# Patient Record
Sex: Male | Born: 1960 | Race: White | Hispanic: No | Marital: Married | Smoking: Current every day smoker
Health system: Southern US, Community
[De-identification: ages and names within clinical notes are randomized; demographics above are authoritative.]

## PROBLEM LIST (undated history)

## (undated) DIAGNOSIS — C411 Malignant neoplasm of mandible: Secondary | ICD-10-CM

## (undated) DIAGNOSIS — K7460 Unspecified cirrhosis of liver: Secondary | ICD-10-CM

## (undated) DIAGNOSIS — C799 Secondary malignant neoplasm of unspecified site: Secondary | ICD-10-CM

## (undated) HISTORY — PX: OTHER SURGICAL HISTORY: SHX169

---

## 2004-08-18 ENCOUNTER — Ambulatory Visit: Payer: Self-pay | Admitting: Occupational Therapy

## 2010-12-09 ENCOUNTER — Emergency Department (HOSPITAL_COMMUNITY)
Admission: EM | Admit: 2010-12-09 | Discharge: 2010-12-09 | Disposition: A | Discharge status: Home / Self Care | Payer: BC Managed Care – PPO | Attending: Emergency Medicine | Admitting: Emergency Medicine

## 2010-12-09 ENCOUNTER — Emergency Department (HOSPITAL_COMMUNITY): Payer: BC Managed Care – PPO

## 2010-12-09 DIAGNOSIS — F17200 Nicotine dependence, unspecified, uncomplicated: Secondary | ICD-10-CM | POA: Insufficient documentation

## 2010-12-09 DIAGNOSIS — J40 Bronchitis, not specified as acute or chronic: Secondary | ICD-10-CM | POA: Insufficient documentation

## 2010-12-09 MED ORDER — HYDROCODONE-ACETAMINOPHEN 5-325 MG PO TABS: 1.0000 | ORAL_TABLET | ORAL | Status: AC | PRN

## 2010-12-09 MED ORDER — HYDROCODONE-ACETAMINOPHEN 5-325 MG PO TABS
2.0000 | ORAL_TABLET | Freq: Once | ORAL | Status: AC
  Administered 2010-12-09: 2 via ORAL
  Filled 2010-12-09: qty 2

## 2010-12-09 MED ORDER — CYCLOBENZAPRINE HCL 10 MG PO TABS: 10.0000 mg | ORAL_TABLET | Freq: Two times a day (BID) | ORAL | Status: AC | PRN

## 2010-12-09 NOTE — ED Notes (Signed)
Pt states he is being tx for pneumonia. States he chest is still sore

## 2010-12-09 NOTE — ED Provider Notes (Signed)
Scribed for Donnetta Hutching, MD, the patient was seen in room APA09/APA09. This chart was scribed by [employer]. The patient's care started at 19:13  CSN: [id number] Arrival date & time: 12/09/2010  7:12 PM   First MD Initiated Contact with Patient 12/09/10 1913      Chief Complaint  Patient presents with  . Cough   HPI Corey Stone is a 50 y.o. male brought in by parents to the Emergency Department complaining of cough. Patient reports he was recently diagnosed with pneumonia and is being treated for that. He localizes pain to the right anterior chest and right upper back. He denies any difficulty breathing. Patient is an everyday smoker. 1-1.5PPD. Patient is in no acute distress and sits up on his bed. There are no other associated symptoms and no other alleviating or aggravating factors.    History reviewed. No pertinent past medical history.  History reviewed. No pertinent past surgical history.  History reviewed. No pertinent family history.  History  Substance Use Topics  . Smoking status: Current Everyday Smoker  . Smokeless tobacco: Not on file  . Alcohol Use: Yes      Review of Systems  Cardiovascular: Positive for chest pain.  Musculoskeletal: Positive for back pain.  All other systems reviewed and are negative.    Allergies  Review of patient's allergies indicates no known allergies.  Home Medications   Current Outpatient Rx  Name Route Sig Dispense Refill  . BC HEADACHE POWDER PO Oral Take 1 packet by mouth daily as needed. For pain     . LEVOFLOXACIN 750 MG PO TABS Oral Take 750 mg by mouth daily. For 7 days     . TETRAHYDROZOLINE-ZN SULFATE 0.05-0.25 % OP SOLN Both Eyes Place 2 drops into both eyes daily as needed. For dry eyes       BP 127/90  Pulse 90  Temp(Src) 98.3 F (36.8 C) (Oral)  Resp 20  Ht 6\' 2"  (1.88 m)  Wt 176 lb (79.833 kg)  BMI 22.60 kg/m2  SpO2 99%  Physical Exam  Nursing note and vitals reviewed. Constitutional: He is oriented  to person, place, and time. He appears well-developed and well-nourished.  Non-toxic appearance. He does not have a sickly appearance.  HENT:  Head: Normocephalic and atraumatic.  Mouth/Throat: Oropharynx is clear and moist. No oropharyngeal exudate.  Eyes: Conjunctivae, EOM and lids are normal. Pupils are equal, round, and reactive to light.  Neck: Trachea normal, normal range of motion and full passive range of motion without pain. Neck supple. No tracheal deviation present.  Cardiovascular: Normal rate, regular rhythm and normal heart sounds.   No murmur heard. Pulmonary/Chest: Effort normal and breath sounds normal. No respiratory distress. He exhibits tenderness (right anterior chest).  Abdominal: Soft. Normal appearance. He exhibits no distension. There is no tenderness. There is no rebound and no CVA tenderness.  Musculoskeletal: Normal range of motion. He exhibits no tenderness.       right upper back.  Neurological: He is alert and oriented to person, place, and time. He has normal strength. No cranial nerve deficit.  Skin: Skin is warm, dry and intact. No rash noted. No erythema.  Psychiatric: He has a normal mood and affect. His behavior is normal.    ED Course  Procedures  DIAGNOSTIC STUDIES: Oxygen Saturation is 99% on room air, normal by my interpretation.    COORDINATION OF CARE: 19:36 - EDP examined patient. Patient to be put on pain management and Chest Xray ordered.  Dg Chest 2 View  12/09/2010  *RADIOLOGY REPORT*  Clinical Data: Recent diagnosis of pneumonia.  Smoker.  CHEST - 2 VIEW  Comparison: None.  Findings: No infiltrate, congestive heart failure or pneumothorax.  Remote right rib fractures.  Minimal apical pleural thickening without associate bony destruction.  Heart size within normal limits.  Prominent left hilum probably vascular in origin.  Stability can be confirmed on follow-up.  IMPRESSION: No infiltrate, congestive heart failure or pneumothorax.   Prominent left hilum probably vascular in origin.  Stability can be confirmed on follow-up.  Original Report Authenticated By: Fuller Canada, M.D.     MDM  Patient complains of pain in his right anterior chest wall and right upper back. Recent diagnosis of pneumonia. Smoker. Taking Levaquin. No fever sweats chills or weight loss  I personally performed the services described in this documentation, which was scribed in my presence. The recorded information has been reviewed and considered.      Donnetta Hutching, MD 12/09/10 2004

## 2014-11-26 ENCOUNTER — Encounter (HOSPITAL_COMMUNITY): Payer: Self-pay | Admitting: *Deleted

## 2014-11-26 ENCOUNTER — Emergency Department (HOSPITAL_COMMUNITY)
Admission: EM | Admit: 2014-11-26 | Discharge: 2014-11-26 | Disposition: A | Discharge status: Home / Self Care | Payer: Self-pay | Attending: Emergency Medicine | Admitting: Emergency Medicine

## 2014-11-26 ENCOUNTER — Emergency Department (HOSPITAL_COMMUNITY): Payer: Self-pay

## 2014-11-26 DIAGNOSIS — Y998 Other external cause status: Secondary | ICD-10-CM | POA: Insufficient documentation

## 2014-11-26 DIAGNOSIS — Z720 Tobacco use: Secondary | ICD-10-CM | POA: Insufficient documentation

## 2014-11-26 DIAGNOSIS — S9031XA Contusion of right foot, initial encounter: Secondary | ICD-10-CM | POA: Insufficient documentation

## 2014-11-26 DIAGNOSIS — Y9289 Other specified places as the place of occurrence of the external cause: Secondary | ICD-10-CM | POA: Insufficient documentation

## 2014-11-26 DIAGNOSIS — S92001A Unspecified fracture of right calcaneus, initial encounter for closed fracture: Secondary | ICD-10-CM | POA: Insufficient documentation

## 2014-11-26 DIAGNOSIS — Y9389 Activity, other specified: Secondary | ICD-10-CM | POA: Insufficient documentation

## 2014-11-26 DIAGNOSIS — W11XXXA Fall on and from ladder, initial encounter: Secondary | ICD-10-CM | POA: Insufficient documentation

## 2014-11-26 MED ORDER — HYDROCODONE-ACETAMINOPHEN 5-325 MG PO TABS: 1.0000 | ORAL_TABLET | ORAL | Status: DC | PRN

## 2014-11-26 MED ORDER — IBUPROFEN 800 MG PO TABS: 800.0000 mg | ORAL_TABLET | Freq: Three times a day (TID) | ORAL | Status: DC

## 2014-11-26 NOTE — ED Provider Notes (Signed)
CSN: [id number]     Arrival date & time 11/26/14  1542 History   First MD Initiated Contact with Patient 11/26/14 1615     Chief Complaint  Patient presents with  . Ankle Pain     (Consider location/radiation/quality/duration/timing/severity/associated sxs/prior Treatment) Patient is a 54 y.o. male presenting with ankle pain. The history is provided by the patient. No language interpreter was used.  Ankle Pain Location:  Ankle Time since incident:  3 days Injury: yes   Mechanism of injury: fall   Fall:    Fall occurred:  From a ladder   Height of fall:  3 feet   Impact surface:  Concrete   Point of impact:  Feet   Entrapped after fall: no   Ankle location:  R ankle Pain details:    Quality:  Sharp and throbbing   Radiates to:  R leg   Severity:  Severe   Onset quality:  Sudden   Timing:  Constant   Progression:  Worsening Chronicity:  New Dislocation: no   Foreign body present:  No foreign bodies Prior injury to area:  No Relieved by:  Nothing Worsened by:  Bearing weight Ineffective treatments:  Elevation, ice and acetaminophen Associated symptoms: swelling    Corey Stone is a 54 y.o. male who presents to the ED with right ankle pain. He reports falling off a ladder 3 days ago about 3 feet and landing on his right foot twisting his ankle. He complains of pain and swelling of the ankle.  History reviewed. No pertinent past medical history. History reviewed. No pertinent past surgical history. History reviewed. No pertinent family history. Social History  Substance Use Topics  . Smoking status: Current Every Day Smoker    Types: Cigarettes  . Smokeless tobacco: None  . Alcohol Use: Yes     Comment: everyday    Review of Systems  Musculoskeletal:       Pain and swelling and bruising right foot/ankle  Skin:       Ecchymosis right foot/ankle  all other systems negative    Allergies  Review of patient's allergies indicates no known allergies.  Home  Medications   Prior to Admission medications   Medication Sig Start Date End Date Taking? Authorizing Provider  Aspirin-Salicylamide-Caffeine (BC HEADACHE POWDER PO) Take 1 packet by mouth daily as needed. For pain     Historical Provider, MD  HYDROcodone-acetaminophen (NORCO/VICODIN) 5-325 MG tablet Take 1 tablet by mouth every 4 (four) hours as needed. 11/26/14   Bristol Soy Orlene OchM Hennie Gosa, NP  ibuprofen (ADVIL,MOTRIN) 800 MG tablet Take 1 tablet (800 mg total) by mouth 3 (three) times daily. 11/26/14   Jalissa Heinzelman Orlene OchM Desman Polak, NP  levofloxacin (LEVAQUIN) 750 MG tablet Take 750 mg by mouth daily. For 7 days     Historical Provider, MD  tetrahydrozoline-zinc (VISINE-AC) 0.05-0.25 % ophthalmic solution Place 2 drops into both eyes daily as needed. For dry eyes     Historical Provider, MD   BP 154/84 mmHg  Pulse 98  Temp(Src) 97.9 F (36.6 C) (Oral)  Resp 18  Ht 6\' 2"  (1.88 m)  Wt 170 lb (77.111 kg)  BMI 21.82 kg/m2  SpO2 96% Physical Exam  Constitutional: He is oriented to person, place, and time. He appears well-developed and well-nourished.  HENT:  Head: Normocephalic and atraumatic.  Eyes: Conjunctivae and EOM are normal.  Neck: Normal range of motion. Neck supple.  Cardiovascular: Normal rate.   Pulmonary/Chest: Effort normal.  Musculoskeletal:  Right ankle: He exhibits swelling (medial) and ecchymosis. He exhibits no laceration and normal pulse. Decreased range of motion: due to pain. Tenderness. Lateral malleolus and medial malleolus tenderness found. Achilles tendon exhibits pain.       Right foot: There is tenderness and swelling. There is normal capillary refill, no deformity and no laceration.       Feet:  Neurological: He is alert and oriented to person, place, and time. No cranial nerve deficit.  Skin: Skin is warm and dry.  Psychiatric: He has a normal mood and affect. His behavior is normal.  Nursing note and vitals reviewed.   ED Course  Procedures  Imaging Review Dg Ankle  Complete Right  11/26/2014  CLINICAL DATA:  Larey Seat off ankle Saturday, right ankle pain and swelling EXAM: RIGHT ANKLE - COMPLETE 3+ VIEW COMPARISON:  None. FINDINGS: Three views of the right ankle submitted. No acute fracture or subluxation. Ankle mortise is preserved. There is mild impacted fracture of the right calcaneus. IMPRESSION: No ankle fracture.  Impacted fracture of the right calcaneus. Electronically Signed   By: Natasha Mead M.D.   On: 11/26/2014 16:14   I have personally reviewed and evaluated these images and lab results as part of my medical decision-making.   MDM  54 y.o. male with pain, swelling and bruising of the right foot and ankle. Placed in posterior splint, crutches, ice, elevation and pain management.. Discussed with the patient in detail need for follow up with ortho and possibility of surgery. Patient voices understanding and agrees with plan. Stable for d/c without neurovascular compromise.   Final diagnoses:  Fractured calcaneus, right, closed, initial encounter        [facility], NP 11/26/14 1706  Bethann Berkshire, MD 11/26/14 2354

## 2014-11-26 NOTE — ED Notes (Signed)
Pt fell off ladder on Saturday about 3-4 feet landing on right ankle, c/o pain and swelling to right ankle

## 2014-11-26 NOTE — Discharge Instructions (Signed)
Your x-ray today shows that you have a comminuted fracture of the heel of your foot. You will need to follow up with the orthopedic doctor to make the decision of whether you need surgery or casting only.  We are giving you narcotic pain medication. Do not drive while taking the narcotic as it will make you sleepy.  Elevate the area as much as possible, apply ice.   You may have also injured your achilles tendon. The orthopedic doctor will reassess that area when the swelling has gone down some.

## 2016-06-14 ENCOUNTER — Emergency Department (HOSPITAL_COMMUNITY): Payer: Self-pay

## 2016-06-14 ENCOUNTER — Emergency Department (HOSPITAL_COMMUNITY)
Admission: EM | Admit: 2016-06-14 | Discharge: 2016-06-14 | Disposition: A | Discharge status: Home / Self Care | Payer: Self-pay | Attending: Emergency Medicine | Admitting: Emergency Medicine

## 2016-06-14 ENCOUNTER — Encounter (HOSPITAL_COMMUNITY): Payer: Self-pay | Admitting: Cardiology

## 2016-06-14 DIAGNOSIS — F17210 Nicotine dependence, cigarettes, uncomplicated: Secondary | ICD-10-CM | POA: Insufficient documentation

## 2016-06-14 DIAGNOSIS — F1290 Cannabis use, unspecified, uncomplicated: Secondary | ICD-10-CM | POA: Insufficient documentation

## 2016-06-14 DIAGNOSIS — R1013 Epigastric pain: Secondary | ICD-10-CM | POA: Insufficient documentation

## 2016-06-14 DIAGNOSIS — R197 Diarrhea, unspecified: Secondary | ICD-10-CM | POA: Insufficient documentation

## 2016-06-14 DIAGNOSIS — Z79899 Other long term (current) drug therapy: Secondary | ICD-10-CM | POA: Insufficient documentation

## 2016-06-14 DIAGNOSIS — F1010 Alcohol abuse, uncomplicated: Secondary | ICD-10-CM | POA: Insufficient documentation

## 2016-06-14 HISTORY — DX: Unspecified cirrhosis of liver: K74.60

## 2016-06-14 LAB — COMPREHENSIVE METABOLIC PANEL
ALBUMIN: 3.9 g/dL (ref 3.5–5.0)
ALT: 61 U/L (ref 17–63)
ANION GAP: 13 (ref 5–15)
AST: 95 U/L — AB (ref 15–41)
Alkaline Phosphatase: 75 U/L (ref 38–126)
BUN: 9 mg/dL (ref 6–20)
CHLORIDE: 102 mmol/L (ref 101–111)
CO2: 23 mmol/L (ref 22–32)
Calcium: 9 mg/dL (ref 8.9–10.3)
Creatinine, Ser: 0.7 mg/dL (ref 0.61–1.24)
GFR calc Af Amer: >60 mL/min (ref >60)
GFR calc non Af Amer: >60 mL/min (ref >60)
GLUCOSE: 86 mg/dL (ref 65–99)
POTASSIUM: 3.7 mmol/L (ref 3.5–5.1)
SODIUM: 138 mmol/L (ref 135–145)
Total Bilirubin: 0.4 mg/dL (ref 0.3–1.2)
Total Protein: 6.8 g/dL (ref 6.5–8.1)

## 2016-06-14 LAB — CBC WITH DIFFERENTIAL/PLATELET
BASOS ABS: 0 10*3/uL (ref 0.0–0.1)
Basophils Relative: 1 %
EOS PCT: 1 %
Eosinophils Absolute: 0.1 10*3/uL (ref 0.0–0.7)
HEMATOCRIT: 45 % (ref 39.0–52.0)
Hemoglobin: 15.9 g/dL (ref 13.0–17.0)
Lymphocytes Relative: 44 %
Lymphs Abs: 3.9 10*3/uL (ref 0.7–4.0)
MCH: 36.7 pg — ABNORMAL HIGH (ref 26.0–34.0)
MCHC: 35.3 g/dL (ref 30.0–36.0)
MCV: 103.9 fL — ABNORMAL HIGH (ref 78.0–100.0)
MONO ABS: 0.8 10*3/uL (ref 0.1–1.0)
Monocytes Relative: 9 %
NEUTROS ABS: 3.9 10*3/uL (ref 1.7–7.7)
Neutrophils Relative %: 45 %
PLATELETS: 241 10*3/uL (ref 150–400)
RBC: 4.33 MIL/uL (ref 4.22–5.81)
RDW: 13.5 % (ref 11.5–15.5)
WBC: 8.6 10*3/uL (ref 4.0–10.5)

## 2016-06-14 LAB — URINALYSIS, ROUTINE W REFLEX MICROSCOPIC
Bilirubin Urine: NEGATIVE
GLUCOSE, UA: NEGATIVE mg/dL
HGB URINE DIPSTICK: NEGATIVE
Ketones, ur: NEGATIVE mg/dL
LEUKOCYTES UA: NEGATIVE
Nitrite: NEGATIVE
Protein, ur: NEGATIVE mg/dL
SPECIFIC GRAVITY, URINE: 1.01 (ref 1.005–1.030)
pH: 6 (ref 5.0–8.0)

## 2016-06-14 LAB — ETHANOL: ALCOHOL ETHYL (B): 283 mg/dL — AB (ref <5)

## 2016-06-14 LAB — LIPASE, BLOOD: Lipase: 36 U/L (ref 11–51)

## 2016-06-14 MED ORDER — HYDROMORPHONE HCL 1 MG/ML IJ SOLN
1.0000 mg | Freq: Once | INTRAMUSCULAR | Status: AC
  Administered 2016-06-14: 1 mg via INTRAVENOUS
  Filled 2016-06-14: qty 1

## 2016-06-14 MED ORDER — IOPAMIDOL (ISOVUE-300) INJECTION 61%
100.0000 mL | Freq: Once | INTRAVENOUS | Status: AC | PRN
  Administered 2016-06-14: 100 mL via INTRAVENOUS

## 2016-06-14 MED ORDER — SODIUM CHLORIDE 0.9 % IV BOLUS (SEPSIS)
500.0000 mL | Freq: Once | INTRAVENOUS | Status: AC
  Administered 2016-06-14: 500 mL via INTRAVENOUS

## 2016-06-14 MED ORDER — SODIUM CHLORIDE 0.9 % IV SOLN
INTRAVENOUS | Status: DC
  Administered 2016-06-14: 18:00:00 via INTRAVENOUS

## 2016-06-14 MED ORDER — ONDANSETRON HCL 4 MG/2ML IJ SOLN
4.0000 mg | Freq: Once | INTRAMUSCULAR | Status: AC
  Administered 2016-06-14: 4 mg via INTRAVENOUS
  Filled 2016-06-14: qty 2

## 2016-06-14 MED ORDER — FAMOTIDINE 20 MG PO TABS: 20.0000 mg | ORAL_TABLET | Freq: Two times a day (BID) | ORAL | 0 refills | Status: AC

## 2016-06-14 NOTE — ED Triage Notes (Signed)
Upper abdominal pain and vomiting  for a while.  Headache times 3 hours.  Was told recently he had cirrhosis of the liver.

## 2016-06-14 NOTE — ED Provider Notes (Signed)
[facility]-EMERGENCY DEPT Provider Note   CSN: [id number] Arrival date & time: 06/14/16  1728     History   Chief Complaint Chief Complaint  Patient presents with  . Abdominal Pain    HPI Corey Stone is a 56 y.o. male.  Patient admits to heavy drinking history. Patient with several week history of epigastric abdominal pain. Associated with nausea sometimes vomiting and sometimes diarrhea. Patient says it sometimes he vomits blood. Patient still drinking alcohol. Denies any blood in his bowel movements.      Past Medical History:  Diagnosis Date  . Cirrhosis (HCC)     There are no active problems to display for this patient.   Past Surgical History:  Procedure Laterality Date  . facial bone surgery    . skin grafts         Home Medications    Prior to Admission medications   Medication Sig Start Date End Date Taking? Authorizing Provider  Aspirin-Salicylamide-Caffeine (BC HEADACHE POWDER PO) Take 1 packet by mouth 3 (three) times daily as needed (for pain). For pain    Yes [provider]  B Complex-Biotin-FA (B-COMPLEX PO) Take 1 tablet by mouth 2 (two) times daily.   Yes [provider]  Cholecalciferol (VITAMIN D) 2000 units CAPS Take 1 capsule by mouth daily.   Yes [provider]  tetrahydrozoline-zinc (VISINE-AC) 0.05-0.25 % ophthalmic solution Place 2 drops into both eyes daily as needed. For dry eyes    Yes [provider]  vitamin B-12 (CYANOCOBALAMIN) 500 MCG tablet Take 500 mcg by mouth 2 (two) times daily.   Yes [provider]    Family History History reviewed. No pertinent family history.  Social History Social History  Substance Use Topics  . Smoking status: Current Every Day Smoker    Types: Cigarettes  . Smokeless tobacco: Never Used  . Alcohol use Yes     Comment: 2 half gallons a week      Allergies   Patient has no known allergies.   Review of Systems Review of Systems  Constitutional:  Negative for fever.  HENT: Negative for congestion.   Eyes: Negative for redness.  Respiratory: Negative for shortness of breath.   Cardiovascular: Negative for chest pain.  Gastrointestinal: Positive for abdominal pain, diarrhea, nausea and vomiting. Negative for blood in stool.  Genitourinary: Negative for dysuria.  Musculoskeletal: Negative for back pain.  Skin: Negative for rash.  Neurological: Negative for headaches.  Hematological: Bruises/bleeds easily.  Psychiatric/Behavioral: Negative for confusion.     Physical Exam Updated Vital Signs BP 120/82 (BP Location: Right Arm)   Pulse 68   Temp 97.7 F (36.5 C) (Oral)   Resp 16   Ht 6\' 2"  (1.88 m)   Wt 70.3 kg   SpO2 99%   BMI 19.90 kg/m   Physical Exam  Constitutional: He is oriented to person, place, and time. He appears well-developed and well-nourished. No distress.  HENT:  Head: Normocephalic and atraumatic.  Mouth/Throat: Oropharynx is clear and moist.  Eyes: Conjunctivae and EOM are normal. Pupils are equal, round, and reactive to light.  Neck: Normal range of motion. Neck supple.  Cardiovascular: Normal rate, regular rhythm and normal heart sounds.   Pulmonary/Chest: Effort normal and breath sounds normal.  Abdominal: Soft. Bowel sounds are normal. There is no tenderness.  Musculoskeletal: Normal range of motion.  Neurological: He is alert and oriented to person, place, and time. No cranial nerve deficit or sensory deficit. He exhibits normal muscle tone.  Coordination normal.  Skin: Skin is warm. No rash noted.  Nursing note and vitals reviewed.    ED Treatments / Results  Labs (all labs ordered are listed, but only abnormal results are displayed) Labs Reviewed  CBC WITH DIFFERENTIAL/PLATELET - Abnormal; Notable for the following:       Result Value   MCV 103.9 (*)    MCH 36.7 (*)    All other components within normal limits  COMPREHENSIVE METABOLIC PANEL - Abnormal; Notable for the following:    AST  95 (*)    All other components within normal limits  ETHANOL - Abnormal; Notable for the following:    Alcohol, Ethyl (B) 283 (*)    All other components within normal limits  URINALYSIS, ROUTINE W REFLEX MICROSCOPIC  LIPASE, BLOOD   Results for orders placed or performed during the hospital encounter of 06/14/16  CBC with Differential  Result Value Ref Range   WBC 8.6 4.0 - 10.5 K/uL   RBC 4.33 4.22 - 5.81 MIL/uL   Hemoglobin 15.9 13.0 - 17.0 g/dL   HCT 16.1 09.6 - 04.5 %   MCV 103.9 (H) 78.0 - 100.0 fL   MCH 36.7 (H) 26.0 - 34.0 pg   MCHC 35.3 30.0 - 36.0 g/dL   RDW 40.9 81.1 - 91.4 %   Platelets 241 150 - 400 K/uL   Neutrophils Relative % 45 %   Neutro Abs 3.9 1.7 - 7.7 K/uL   Lymphocytes Relative 44 %   Lymphs Abs 3.9 0.7 - 4.0 K/uL   Monocytes Relative 9 %   Monocytes Absolute 0.8 0.1 - 1.0 K/uL   Eosinophils Relative 1 %   Eosinophils Absolute 0.1 0.0 - 0.7 K/uL   Basophils Relative 1 %   Basophils Absolute 0.0 0.0 - 0.1 K/uL  Comprehensive metabolic panel  Result Value Ref Range   Sodium 138 135 - 145 mmol/L   Potassium 3.7 3.5 - 5.1 mmol/L   Chloride 102 101 - 111 mmol/L   CO2 23 22 - 32 mmol/L   Glucose, Bld 86 65 - 99 mg/dL   BUN 9 6 - 20 mg/dL   Creatinine, Ser 7.82 0.61 - 1.24 mg/dL   Calcium 9.0 8.9 - 95.6 mg/dL   Total Protein 6.8 6.5 - 8.1 g/dL   Albumin 3.9 3.5 - 5.0 g/dL   AST 95 (H) 15 - 41 U/L   ALT 61 17 - 63 U/L   Alkaline Phosphatase 75 38 - 126 U/L   Total Bilirubin 0.4 0.3 - 1.2 mg/dL   GFR calc non Af Amer >60 >60 mL/min   GFR calc Af Amer >60 >60 mL/min   Anion gap 13 5 - 15  Urinalysis, Routine w reflex microscopic  Result Value Ref Range   Color, Urine YELLOW YELLOW   APPearance CLEAR CLEAR   Specific Gravity, Urine 1.010 1.005 - 1.030   pH 6.0 5.0 - 8.0   Glucose, UA NEGATIVE NEGATIVE mg/dL   Hgb urine dipstick NEGATIVE NEGATIVE   Bilirubin Urine NEGATIVE NEGATIVE   Ketones, ur NEGATIVE NEGATIVE mg/dL   Protein, ur NEGATIVE  NEGATIVE mg/dL   Nitrite NEGATIVE NEGATIVE   Leukocytes, UA NEGATIVE NEGATIVE  Ethanol  Result Value Ref Range   Alcohol, Ethyl (B) 283 (H) <5 mg/dL  Lipase, blood  Result Value Ref Range   Lipase 36 11 - 51 U/L     EKG  EKG Interpretation None       Radiology No results found.  Procedures  Procedures (including critical care time)  Medications Ordered in ED Medications  0.9 %  sodium chloride infusion ( Intravenous New Bag/Given 06/14/16 1828)  sodium chloride 0.9 % bolus 500 mL (0 mLs Intravenous Stopped 06/14/16 1930)  ondansetron (ZOFRAN) injection 4 mg (4 mg Intravenous Given 06/14/16 1826)  HYDROmorphone (DILAUDID) injection 1 mg (1 mg Intravenous Given 06/14/16 1826)     Initial Impression / Assessment and Plan / ED Course  I have reviewed the triage vital signs and the nursing notes.  Pertinent labs & imaging results that were available during my care of the patient were reviewed by me and considered in my medical decision making (see chart for details).   patient with a history of alcohol abuse. Significant alcohol level here today. Patient very functional. Labs without significant maladies. Liver function tests without significant abnormalities. No evidence of pancreatitis. CT scan negative for any acute findings. Suspect that epigastric abdominal pain may be related to alcohol gastritis or peptic ulcer disease. Also no evidence of any significant anemia. We'll give a two-week trial of Pepcid. And referral to GI medicine.    Final Clinical Impressions(s) / ED Diagnoses   Final diagnoses:  Alcohol abuse  Epigastric abdominal pain    New Prescriptions New Prescriptions   No medications on file     Vanetta MuldersZackowski, Quindon Denker, MD 06/14/16 2143

## 2016-06-14 NOTE — Discharge Instructions (Signed)
Take the Pepcid as directed for the next 2 weeks. Information provided for alcohol abuse problems. Return for any new or worse symptoms. Follow-up with GI medicine.

## 2019-01-02 IMAGING — CT CT ABD-PELV W/ CM
2 of 5 series · 16 of 46 positions shown, 18 images · IV contrast (Isovue)
Comparison: None.

CLINICAL DATA: Epigastric abdominal pain.  Vomiting.

EXAM:
CT ABDOMEN AND PELVIS WITH CONTRAST
TECHNIQUE: Multidetector CT imaging of the abdomen and pelvis was performed
using the standard protocol following bolus administration of
intravenous contrast.
CONTRAST:  100mL S46AB1-RXX IOPAMIDOL (S46AB1-RXX) INJECTION 61%

[Series 2: axial st · axial · 0.72mm/px · z∈[-660,-230]mm · 13 of 98 slices shown, 15 images]
[im 6/98  soft-tissue]
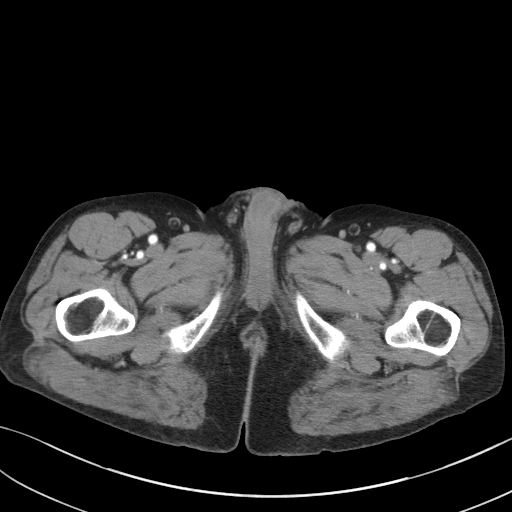
[im 6/98  bone]
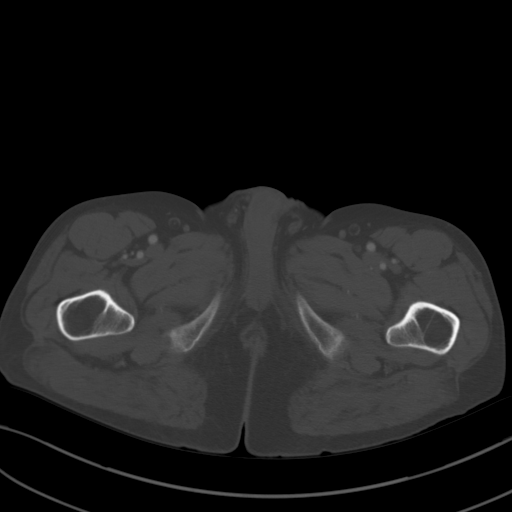
[im 11/98  soft-tissue]
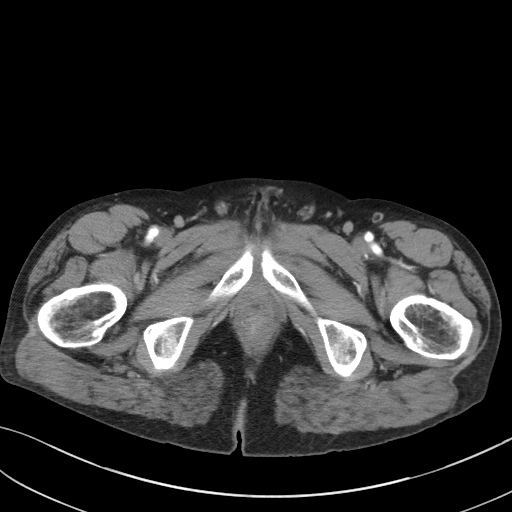
[im 22/98  soft-tissue]
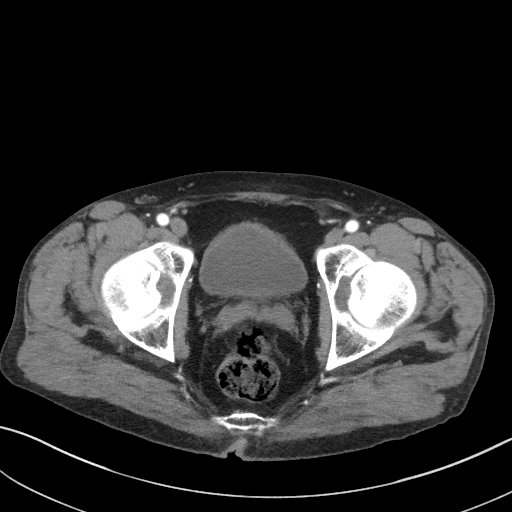
[im 27/98  soft-tissue]
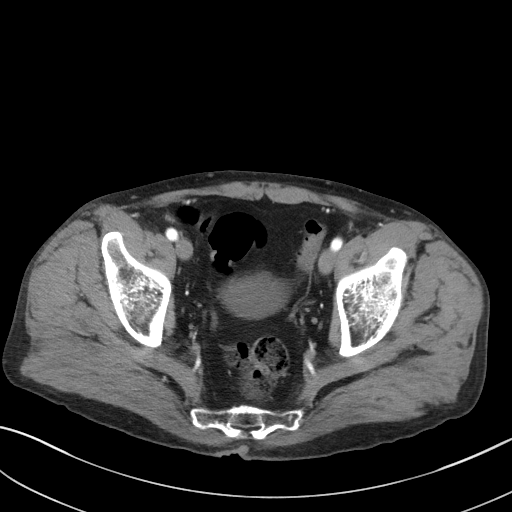
[im 33/98  soft-tissue]
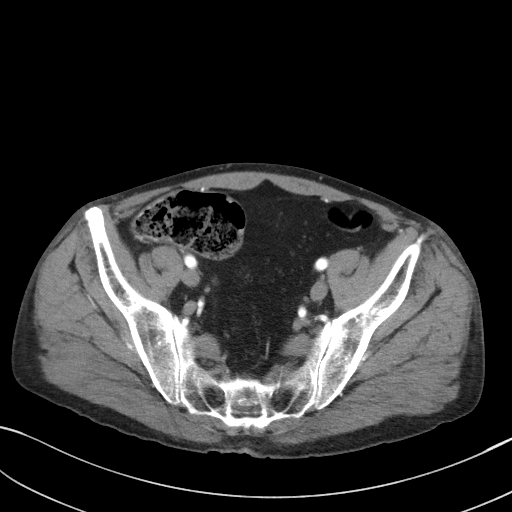
[im 44/98  soft-tissue]
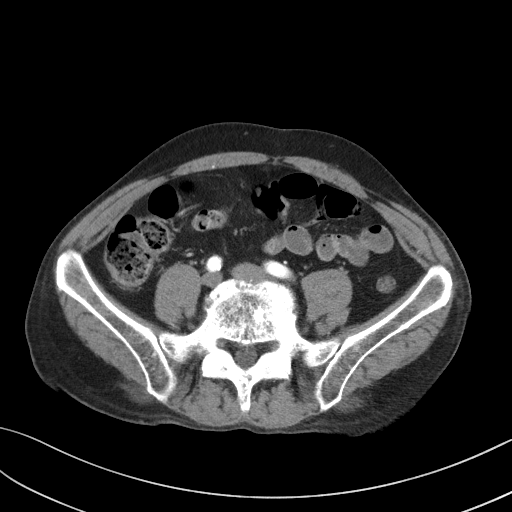
[im 49/98  soft-tissue]
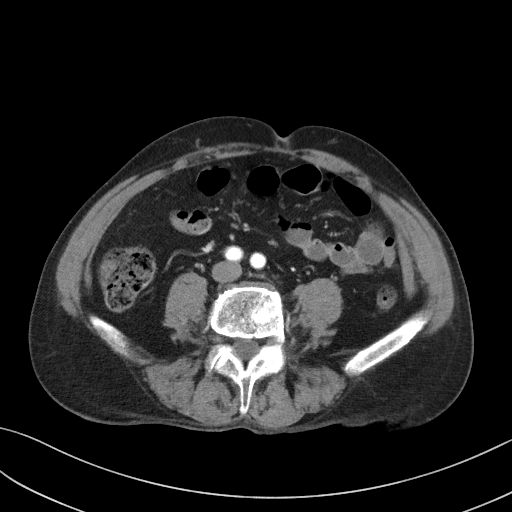
[im 54/98  soft-tissue]
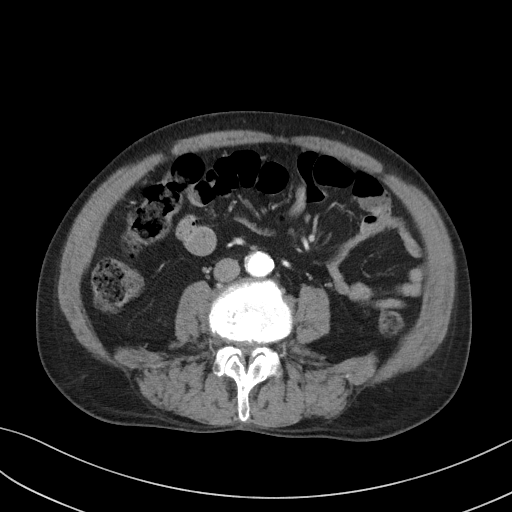
[im 65/98  soft-tissue]
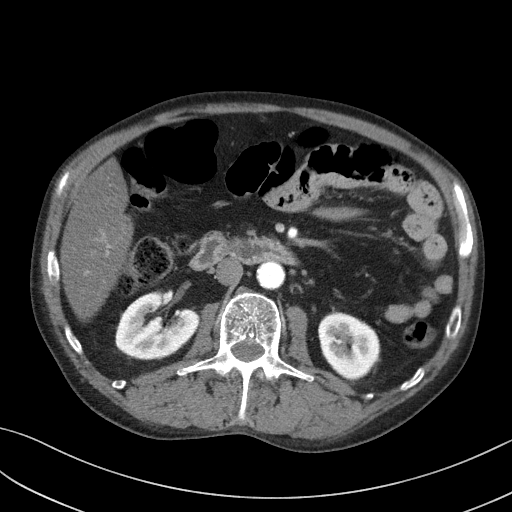
[im 65/98  bone]
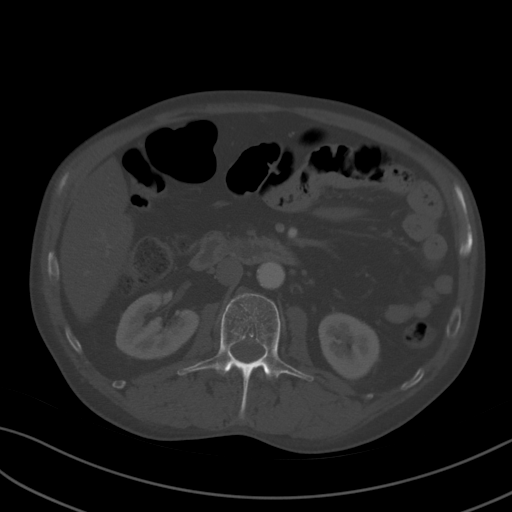
[im 71/98  soft-tissue]
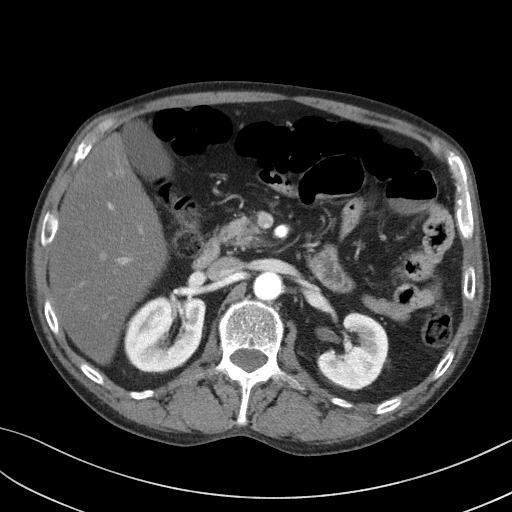
[im 76/98  soft-tissue]
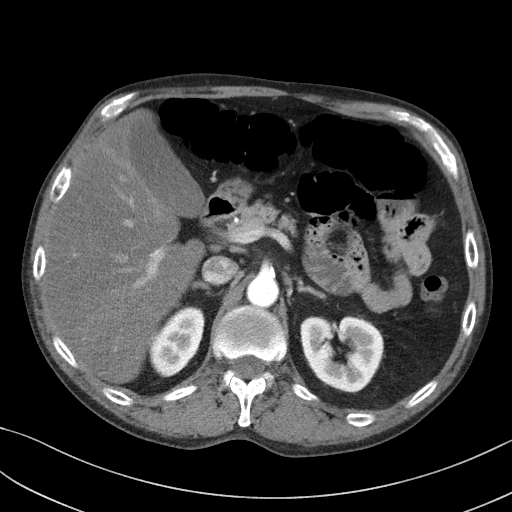
[im 87/98  soft-tissue]
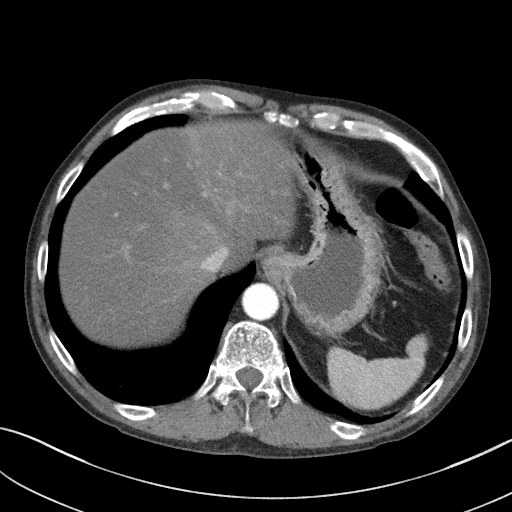
[im 92/98  soft-tissue]
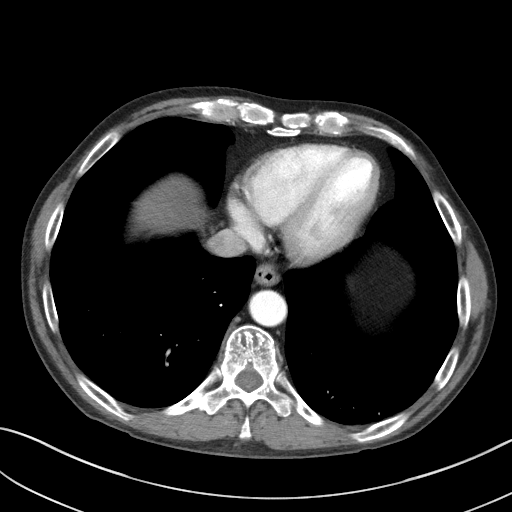

[Series 4: coronal st · coronal · 0.70mm/px · 3 of 91 slices shown]
[im 31/91  soft-tissue]
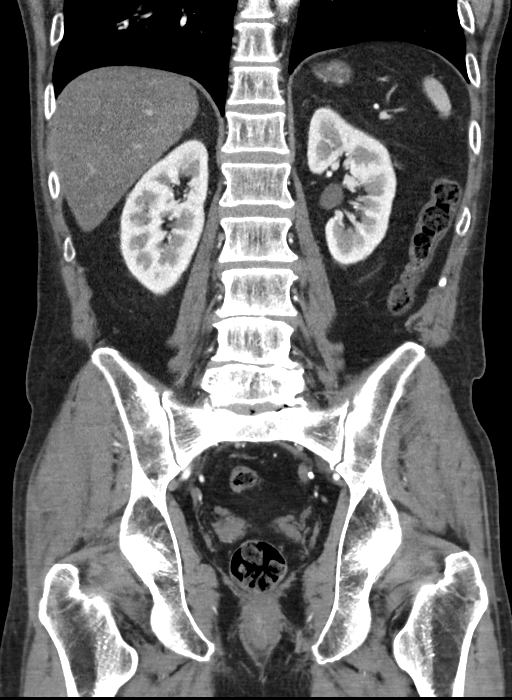
[im 41/91  soft-tissue]
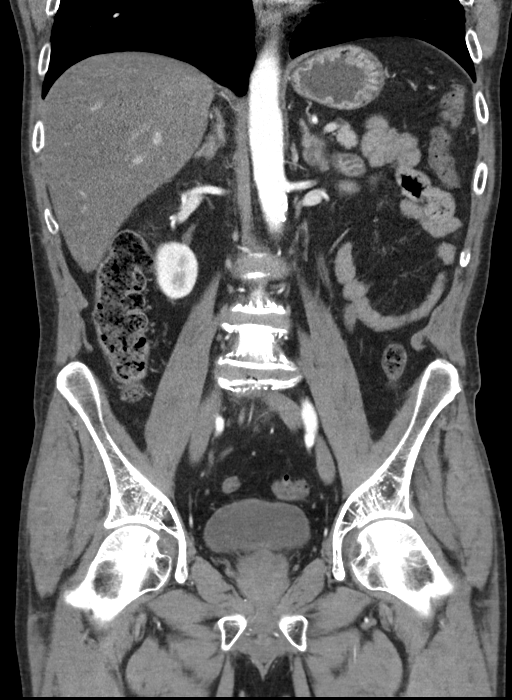
[im 51/91  soft-tissue]
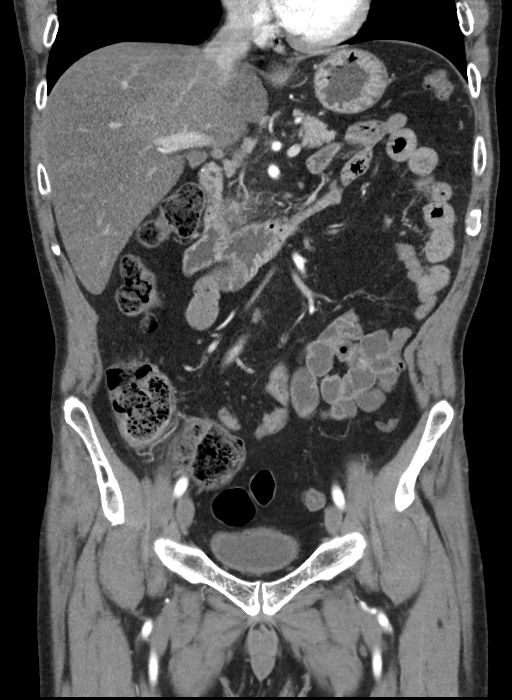

[16 of 46 positions shown; findings below may reference images not displayed]

FINDINGS: Lower chest: No consolidation or pleural fluid. Heart size is
normal.

Hepatobiliary: Severely decreased hepatic density consistent with
steatosis. No evidence of focal lesion. Gallbladder physiologically
distended, no calcified stone. No biliary dilatation.

Pancreas: No ductal dilatation or inflammation.

Spleen: Normal in size without focal abnormality.

Adrenals/Urinary Tract: Adrenal glands are unremarkable. Kidneys are
normal, without renal calculi, focal lesion, or hydronephrosis.
Bladder is unremarkable.

Stomach/Bowel: Scattered colonic diverticulosis without acute
inflammation. Appendix tentatively identified and normal parent no
small bowel dilatation, wall thickening or inflammation. Mild distal
esophageal wall thickening.

Vascular/Lymphatic: Aortic atherosclerosis without aneurysm. No
abdominal or pelvic adenopathy.

Reproductive: Prostate is unremarkable.

Other: No ascites or free air. Minimal fat in the left inguinal
canal.

Musculoskeletal: Multilevel degenerative change throughout spine.
Bones appear under mineralized There are no acute or suspicious
osseous abnormalities.
IMPRESSION: 1. No acute abnormality in the abdomen/pelvis.
2. Significantly decreased hepatic density consistent with steatosis
or other chronic liver disease.
3. Colonic diverticulosis without acute inflammation.
4. Aortic atherosclerosis.

## 2023-07-02 ENCOUNTER — Emergency Department (HOSPITAL_COMMUNITY)
Admission: EM | Admit: 2023-07-02 | Discharge: 2023-07-02 | Disposition: A | Discharge status: Home / Self Care | Attending: Emergency Medicine | Admitting: Emergency Medicine

## 2023-07-02 DIAGNOSIS — T50901A Poisoning by unspecified drugs, medicaments and biological substances, accidental (unintentional), initial encounter: Secondary | ICD-10-CM

## 2023-07-02 DIAGNOSIS — Z85828 Personal history of other malignant neoplasm of skin: Secondary | ICD-10-CM | POA: Diagnosis not present

## 2023-07-02 DIAGNOSIS — Z85830 Personal history of malignant neoplasm of bone: Secondary | ICD-10-CM | POA: Insufficient documentation

## 2023-07-02 DIAGNOSIS — Z7982 Long term (current) use of aspirin: Secondary | ICD-10-CM | POA: Diagnosis not present

## 2023-07-02 DIAGNOSIS — T402X1A Poisoning by other opioids, accidental (unintentional), initial encounter: Secondary | ICD-10-CM | POA: Diagnosis present

## 2023-07-02 NOTE — ED Provider Notes (Signed)
 [place] [facility]ERGENCY DEPARTMENT AT [facility] Provider Note   CSN: [id number] Arrival date & time: 07/02/23  1102     History  Chief Complaint  Patient presents with   Drug Overdose    Corey Stone is a 63 y.o. male presents to the emergency department with a chief complaint of drug overdose.  Patient states that at roughly 7 AM this morning he ingested 7 of his oxycodone pills.  Patient states that he got his pills mixed up and meant to take steroid medication but instead overdosed on his pain pills.  Patient states that immediately after this he went on with his day as normal, but roughly 2 hours later began to feel dizzy and lightheaded.  He returned back inside and laid down.  His wife was there and was concerned, so she eventually called 911 and the patient was brought by EMS.  Patient denies shortness of breath, syncope.  Patient appreciates 2-3 episodes of vomiting at home, but states he is no longer nauseous.  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 Denies SI.   Drug Overdose Pertinent negatives include no chest pain, no abdominal pain and no shortness of breath.       Home Medications Prior to Admission medications   Medication Sig Start Date End Date Taking? Authorizing Provider  Aspirin-Salicylamide-Caffeine (BC HEADACHE POWDER PO) Take 1 packet by mouth 3 (three) times daily as needed (for pain). For pain     [provider]  B Complex-Biotin-FA (B-COMPLEX PO) Take 1 tablet by mouth 2 (two) times daily.    [provider]  Cholecalciferol (VITAMIN D) 2000 units CAPS Take 1 capsule by mouth daily.    [provider]  famotidine  (PEPCID ) 20 MG tablet Take 1 tablet (20 mg total) by mouth 2 (two) times daily. 06/14/16   Zackowski, Scott, MD  tetrahydrozoline-zinc (VISINE-AC) 0.05-0.25 % ophthalmic solution Place 2 drops into both eyes  daily as needed. For dry eyes     [provider]  vitamin B-12 (CYANOCOBALAMIN) 500 MCG tablet Take 500 mcg by mouth 2 (two) times daily.    [provider]      Allergies    Patient has no known allergies.    Review of Systems   Review of Systems  Constitutional:  Negative for appetite change, chills, diaphoresis and fever.  Eyes:  Negative for visual disturbance.  Respiratory:  Negative for chest tightness and shortness of breath.   Cardiovascular:  Negative for chest pain.  Gastrointestinal:  Positive for vomiting (Patient states he threw up 1-2 times at home after overdosing on the oxycodone). Negative for abdominal pain and diarrhea.  Skin:  Negative for rash and wound.  Neurological:  Positive for light-headedness. Negative for seizures, syncope and weakness.  Psychiatric/Behavioral:  Negative for confusion.     Physical Exam Updated Vital Signs BP (!) 170/81   Pulse (!) 48   Temp (!) 94.9 F (34.9 C) (Rectal)   Resp 14   SpO2 95%  Physical Exam Vitals and nursing note reviewed.  Constitutional:      General: He is awake. He is not in acute distress.    Appearance: Normal appearance. He is not ill-appearing, toxic-appearing or diaphoretic.  HENT:     Head: Normocephalic and atraumatic.  Eyes:     General: No scleral icterus.    Extraocular Movements: Extraocular movements intact.  Pupils: Pupils are equal, round, and reactive to light.  Cardiovascular:     Rate and Rhythm: Normal rate and regular rhythm.     Pulses: Normal pulses.  Pulmonary:     Effort: Pulmonary effort is normal. No respiratory distress.     Breath sounds: Normal breath sounds. No wheezing, rhonchi or rales.  Abdominal:     General: Abdomen is flat.     Palpations: Abdomen is soft.  Musculoskeletal:        General: Normal range of motion.  Skin:    General: Skin is warm and dry.     Capillary Refill: Capillary refill takes less than 2 seconds.  Neurological:      General: No focal deficit present.     Mental Status: He is alert and oriented to person, place, and time.     GCS: GCS eye subscore is 4. GCS verbal subscore is 5. GCS motor subscore is 6.     Cranial Nerves: No cranial nerve deficit.     Motor: No weakness.     Coordination: Coordination normal.     Gait: Gait normal.  Psychiatric:        Mood and Affect: Mood normal.        Behavior: Behavior normal. Behavior is cooperative.     ED Results / Procedures / Treatments   Labs (all labs ordered are listed, but only abnormal results are displayed) Labs Reviewed - No data to display  EKG None  Radiology No results found.  Procedures Procedures    Medications Ordered in ED Medications - No data to display  ED Course/ Medical Decision Making/ A&P    Patient presents to the ED for concern of intentional drug overdose, this involves an extensive number of treatment options, and is a complaint that carries with it a high risk of complications and morbidity.  The differential diagnosis includes drug overdose, respiratory depression, suicidal ideation, homicidal ideation, intentional drug overdose, etc.   Co morbidities that complicate the patient evaluation  Metastatic squamous cell carcinoma   Additional history obtained:  Additional history obtained from Outside Medical Records   External records from outside source obtained and reviewed including most recent oxycodone prescription   Cardiac Monitoring:  The patient was maintained on a cardiac monitor.  I personally viewed and interpreted the cardiac monitored which showed an underlying rhythm of: Sinus   Medicines ordered and prescription drug management:  I have reviewed the patients home medicines and have made adjustments as needed   Problem List / ED Course:  Drug overdose Patient got his medicines mixed up this morning and took 7 of his immediate release 5 mg oxycodone tablets at approximately 7 AM this  morning Patient complaining of dizziness and drowsiness, wife called EMS Poison control consulted who recommend continued observation for approximately 1 hour due to half-life of oxycodone, recommend p.o. challenge as well as ambulation test in the emergency department prior to discharge Patient observed in the emergency department for approximately 2 hours, patient tolerated fluids and solids and ambulated without difficulty.  Oral temp 97.4 prior to discharge personally taken by me Patient oriented, states he is now feeling better  Patient discharged, instructed to follow-up with primary care as well as specialist as scheduled Return Precautions given   Reevaluation:  After the interventions noted above, I reevaluated the patient and found that they have : Improved   Social Determinants of Health:  None   Dispostion:  After consideration of the diagnostic results and  the patients response to treatment, I feel that the patent would benefit from discharge and follow-up with PCP as specialist as scheduled.  Click here for ABCD2, HEART and other calculatorsREFRESH Note before signing :1}                              Medical Decision Making         Final Clinical Impression(s) / ED Diagnoses Final diagnoses:  Accidental overdose, initial encounter    Rx / DC Orders ED Discharge Orders     None         Susanne Epps 07/02/23 1502    Early Glisson, MD 07/03/23 [phone]

## 2023-07-02 NOTE — Discharge Instructions (Addendum)
 It was a pleasure taking care of you today.  Today we evaluated you after your unintentional overdose.  Based on your history, physical exam and consultation with poison control we believe you are safe for discharge at this time.  If you experience any of the following symptoms including but not limited to worsening drowsiness, shortness of breath, dizziness, lightheadedness please return to the emergency department. Please follow-up with your primary care provider and specialists as scheduled and make them aware of the events of today.

## 2023-07-02 NOTE — ED Triage Notes (Signed)
 Pt arrived via [facility] states he took 7 of his 5mg  oxycodone Rx at once. States he feel sleepy and dizzy

## 2023-07-02 NOTE — ED Notes (Signed)
 Oxycodone Rx given to [employer], was counted by This Tax inspector. [employer] RN sent pts medication to pharmacy for safe storage

## 2023-07-02 NOTE — ED Notes (Signed)
 Pt given a urinal, crackers, and ginger ale.

## 2023-07-02 NOTE — ED Notes (Signed)
 [employer] gave pts oxycodone pills to pts wife at bedside, count verified by this RN and Engineer, drilling

## 2023-08-09 NOTE — Progress Notes (Signed)
 GENITOURINARY ONCOLOGY CLINIC - RETURN VISIT NOTE  We are happy to address any questions you may have regarding what is written in your note at your next clinic visit, or you can call [phone] and schedule a clinic visit to discuss your note. Unfortunately, we will not be able to discuss these notes via telephone or MyChart messages.  PATIENT IDENTIFICATION  Corey Stone is a 63 y.o. male from [place]  [id number] who is seen in follow up for kidney mass.  ASSESSMENT & PLAN   1. Squamous cell carcinoma of bladder (CMS/HHS-HCC)   2. 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 Malignant neoplasm of overlapping sites of bladder (CMS/HHS-HCC)   4. Metastasis to bone (CMS/HHS-HCC)    Assessment & Plan  1. Metastatic carcinoma with squamous differentiation, suspect urothelial primary Following EV/pembro cycle 2, he, unfortunately, experienced diffuse, maculopapular skin rash that required hospitalization 4/25 to 4/28. He was seen by dermatology and prescribed steroid taper. He was also treated for thrush during that admission. Inpatient dermatology cleared him for future cycles of EV/pembro, but recommended holding off on treatment originally planned for 4/29. The treatments on 4/29 and 5/6 were held, as rash had not completely healed. Wife was encouraged to continue dressing changes on most severe area. Restaged after cycle 2 due to ongoing adverse effects on 06/24/23. PET CT shows a remarkable response with impressive decrease in size of the renal mass, nearby nodes, and lung nodule. His scan also showed that he had developed pneumonitis from treatment. Given skin and lung toxicity along with remarkable response, held therapy and entered surveillance.   - Will plan for Signatera every 6 weeks and scans every 12 weeks. If has evidence of recurrence, will discuss restarting therapy with dose adjustment. - Return to clinic in 6 weeks for labs, scans, and visit with Dr  Keenan. He knows to contact us  should he have any questions or concerns in the interim.  2. Transaminase elevation (Grade 1) Liver function tests have returned to normal levels, indicating resolution of transaminase elevation.  3. Blurred vision at times Intermittent blurred vision persists but is not present at the time of the visit.  4. Drug rash/pruritus Drug rash and pruritus have resolved completely, including on the hands.  5. Pneumonitis, Grade 2 Pneumonitis appears improved, with no cough or sputum production. Shortness of breath persists, especially with exertion. - Instruct to use tiotropium bromide inhaler as prescribed, 2 inhalations once daily.  General Health Maintenance Discussed the importance of maintaining nutritional intake despite lack of appetite, especially during the summer months when appetite may decrease. - Encourage consumption of nutrient-dense foods to maintain energy levels. - Increase water intake to address slightly elevated creatinine levels.   ONCOLOGY HISTORY    Cancer Staging <redacted file path>  No matching staging information was found for the patient.   No specialty comments available.  The patient's disease status is up-to-date as described in the oncology history above.  MOLECULAR DIAGNOSTICS AND MARKERS     INTERVAL HISTORY   History of Present Illness Corey Stone is a 63 y.o. with bladder cancer who returns to clinic for follow up. He is accompanied by his spouse.  He experiences frequent headaches and occasional morning vomiting, along with persistent fatigue and low energy levels. His energy levels improved when on steroids. He has a history of pneumonitis previously treated with steroids.  He experiences shortness of breath, especially when walking outside. He uses Spiriva, taking two inhalations once  a day as needed, though sometimes less frequently. No current cough or sputum production.  He has issues with appetite,  often not feeling hungry, especially during the summer. He eats sporadically, often consuming snacks like Pop-Tarts rather than substantial meals. His spouse encourages him to eat more to improve energy levels.  No recent episodes of blurry vision or rash, both of which have resolved. He drinks three to four bottles of Gatorade daily and also consumes water and coffee.   NCCN Distress Questionnaire  Questionnaire Response Comments  Patient declines NCCN Distress Questionnaire:        Distress Thermometer Score (0-10) (Patient-Rptd) (P) 5   Comments:     Concern Response Comments  Physical  Concerns (Patient-Rptd) (P) Fatigue    Emotional Concerns (Patient-Rptd) (P) None of the above    Social Concerns (Patient-Rptd) (P) None of the above    Practical Concerns (Patient-Rptd) (P) None of the above    Spiritual/Religious Concerns (Patient-Rptd) (P) None of the above    Other Concerns     Resources Provided      Referrals Provided       HISTORY AND MEDICATIONS   Past Medical History:  Diagnosis Date  . Adenocarcinoma of lung, stage 4, unspecified laterality (CMS/HHS-HCC) 06/02/2022  . Alcohol abuse   . Alcoholic hepatitis without ascites (CMS/HHS-HCC) 03/24/2017   LVP, 3L. Cell count 13, protein < 1.0          02/08/2017 Lt and Rt Pleural effusion on CT imaging, 02/03/2017  Lasix 20mg /Aldactone 50mg   SBP ppx. Daily  Alcohol quit date: 01/31/2017  . CINV (chemotherapy-induced nausea and vomiting) 07/20/2022  . Cirrhosis (CMS/HHS-HCC)   . Encounter for antineoplastic chemotherapy 09/30/2020  . History of blood transfusion ?   During Back surgery  . Hx of head and neck radiation 08/25/2021  . Hypercholesterolemia 07/22/2022  . 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 Portal hypertension with esophageal varices  03/24/2017   EGD- EVG3, non-bleeding           Mild PHG           No gastric varices or recent bleeding            NSBB, Nadolol              02/11/2017  . Smoker    Past Surgical History:  Procedure Laterality Date  . COLONOSCOPY  08/09/2012   Adenomatous Polyps: CBF 08/2015; Recall Ltr mailed 06/13/2015 (dw); Recall Ltr Returned & phone no longer in service (dw)  . EGD N/A 02/11/2017   Procedure: EGD;  Surgeon: Melda Greig Norris, MD;  [facility];  Service: Gastroenterology;  Laterality: N/A;  . BACK SURGERY  07/11/2017   lumbar disc  . COLONOSCOPY W/REMOVAL LESIONS BY SNARE N/A 08/03/2018   Procedure: Colonoscopy;  Surgeon: Willma Morene Shad, MD;  Location: [facility];  Service: Gastroenterology;  Laterality: N/A;  . EGD N/A 08/03/2018   Procedure: ESOPHAGOGASTRODUODENOSCOPY, FLEXIBLE, TRANSORAL; DIAGNOSTIC, INCLUDING COLLECTION OF SPECIMEN(S) BY BRUSHING OR WASHING, WHEN PERFORMED (SEPARATE PROCEDURE);  Surgeon: Willma Morene Shad, MD;  Location: [facility];  Service: Gastroenterology;  Laterality: N/A;  . LARYNGOSCOPY N/A 09/09/2020   Procedure: LARYNGOSCOPY DIRECT, WITH OR WITHOUT TRACHEOSCOPY; DIAGNOSTIC, EXCEPT NEWBORN;  Surgeon: Zackery Toribio Agent, MD;  Location: [facility];  Service: Otolaryngology Head and Neck;  Laterality: N/A;  . ESOPHAGOSCOPY N/A 09/09/2020   Procedure: ESOPHAGOSCOPY, FLEXIBLE, TRANSORAL; DIAGNOSTIC, INCLUDING COLLECTION OF SPECIMEN(S) BY BRUSHING OR WASHING,  WHEN PERFORMED (SEPARATE PROCEDURE);  Surgeon: Zackery Toribio Agent, MD;  [facility];  Service: Otolaryngology Head and Neck;  Laterality: N/A;  . CERVICAL LYMPHADENECTOMY Right 09/09/2020   Procedure: CERVICAL LYMPHADENECTOMY (MODIFIED RADICAL NECK DISSECTION);  Surgeon: Zackery Toribio Agent, MD;  Location: [facility] OR;  Service: Otolaryngology Head and Neck;  Laterality: Right;  . TRACHEOSTOMY ADULT W/SKIN FLAPS N/A 09/09/2020   Procedure: TRACHEOSTOMY, FENESTRATION PROCEDURE WITH SKIN FLAPS;  Surgeon: Zackery Toribio Agent, MD;  Location: [facility];  Service: Otolaryngology  Head and Neck;  Laterality: N/A;  . REMOVAL TONGUE W/MOUTH/JAW RESECTION Right 09/09/2020   Procedure: GLOSSECTOMY; COMPOSITE PROCEDURE WITH RESECTION FLOOR OF MOUTH AND MANDIBULAR RESECTION, WITHOUT RADICAL NECK DISSECTION;  Surgeon: Zackery Toribio Agent, MD;  Location: [facility];  Service: Otolaryngology Head and Neck;  Laterality: Right;  . CREATION OSTEOCUTANEOUS FLAP W/MICROVASCULAR ANASTAMOSIS METATARSAL N/A 09/09/2020   Procedure: FREE OSTEOCUTANEOUS FLAP WITH MICROVASCULAR ANASTOMOSIS; OTHER THAN ILIAC CREST, METATARSAL, OR GREAT TOE;  Surgeon: Allene Ashok Carwin, MD;  Location: [facility];  Service: Otolaryngology Head and Neck;  Laterality: N/A;  left vs right  . RECONSTRUCTION MANDIBLE W/TRANSOSTEAL BONE PLATE Right 91/97/7977   Procedure: RECONSTRUCTION OF MANDIBLE, EXTRAORAL, WITH TRANSOSTEAL BONE PLATE (EG, MANDIBULAR STAPLE BONE PLATE);  Surgeon: Allene Ashok Carwin, MD;  Location: [facility] OR;  Service: Otolaryngology Head and Neck;  Laterality: Right;  . SPLIT THICKNESS SKIN GRAFT LEG Right 09/09/2020   Procedure: SPLIT-THICKNESS AUTOGRAFT, LEG; FIRST 100 SQ CM OR LESS, OR 1% OF BODY AREA OF INFANT/CHILD (EXCEPT [address]);  Surgeon: Allene Ashok Carwin, MD;  Location: [facility] OR;  Service: Otolaryngology Head and Neck;  Laterality: Right;  . COLONOSCOPY W/REMOVAL LESIONS BY SNARE N/A 11/18/2021   Procedure: COLONOSCOPY, FLEXIBLE; WITH REMOVAL OF TUMOR(S), POLYP(S), OR OTHER LESION(S) BY SNARE TECHNIQUE;  Surgeon: Roper, Jatin, MD;  Location: [facility];  Service: Gastroenterology;  Laterality: N/A;  . BIOPSY EXCAL BONE LEG Left 04/09/2022   Procedure: BIOPSY, BONE, OPEN; DEEP LEG, left femur;  Surgeon: Visgauss, Recardo Morrison, MD;  Location: [facility];  Service: Orthopedics;  Laterality: Left;  . TREATMENT PROPHYLACTIC FEMUR W/POSSIBLE CEMENT Left 04/09/2022   Procedure: PROPHYLACTIC TREATMENT NAILING WITH OR WITHOUT METHYLMETHACRYLATE, FEMUR, left femur;  Surgeon:  Visgauss, Recardo Morrison, MD;  Location: [facility];  Service: Orthopedics;  Laterality: Left;  . INTRAOPERATIVE FLUOROSCOPY N/A 04/09/2022   Procedure: FLUOROSCOPY (SEPARATE PROCEDURE), UP TO 1 HOUR PHYSICIAN OR OTHER QUALIFIED HEALTH CARE PROFESSIONAL TIME;  Surgeon: Visgauss, Recardo Morrison, MD;  Location: [facility];  Service: Orthopedics;  Laterality: N/A;  . FRACTURE SURGERY    . LIVER BIOPSY     Family History  Problem Relation Age of Onset  . Breast cancer Mother        Remission  . Skin cancer Mother        Removed  . COPD Father        Decreased  . High blood pressure (Hypertension) Father        Took hbp meds, slight heart attack  . Prostate cancer Brother        Remission prostate cancer  . No Known Problems Son   . Skin cancer Maternal Aunt        Removed  . Skin cancer Maternal Uncle        Removed  . No Known Problems Sister   . No Known Problems Sister   . High blood pressure (Hypertension) Maternal Grandfather   .  Alzheimer's disease Maternal Grandmother        Pass [id number]  . Stroke Maternal Grandmother        Mini strokes  . Anesthesia problems Neg Hx   . Malignant hyperthermia Neg Hx   . Malignant hypertension Neg Hx    Social History   Socioeconomic History  . Marital status: Married    Spouse name: Rosealee  . Number of children: 2  . Highest education level: 9th grade  Occupational History  . Occupation: Holiday representative    Comment: last worked Oct 2018  Tobacco Use  . Smoking status: Former    Current packs/day: 0.00    Average packs/day: 1.5 packs/day for 40.0 years (60.0 ttl pk-yrs)    Types: Cigarettes    Start date: 09/09/1980    Quit date: 09/09/2020    Years since quitting: 2.9    Passive exposure: Past  . Smokeless tobacco: Never  . Tobacco comments:    Quit smoking 09/09/2020  Vaping Use  . Vaping status: Never Used  Substance and Sexual Activity  . Alcohol use: Not Currently    Comment: Quit drinking any alcohol from Jan 31 2017 -  current day  . Drug use: Yes    Types: Marijuana    Comment: Marijuana  . Sexual activity: Yes    Partners: Female    Birth control/protection: Surgical    Comment: Tubaligation 08/1982 (wife)  Social History Narrative   11/12/2022      Likes/Enjoys/What fills your day?:  Narrative   Home: lives in one level home   Your Bedrooms is on: First Level   Fewest Steps to enter the home: 3   Bathroom:  Bathtub and has shower bar   Other persons in the home: wife   Pets: dog   Support System:   Medical equipment you use daily: None   Medical equipment available in the home: Blood Pressure Machine       Social Drivers of Health   Financial Resource Strain: Low Risk  (06/03/2023)   Overall Financial Resource Strain (CARDIA)   . Difficulty of Paying Living Expenses: Not hard at all  Food Insecurity: No Food Insecurity (06/07/2023)   Hunger Vital Sign   . Worried About Programme researcher, broadcasting/film/video in the Last Year: Never true   . Ran Out of Food in the Last Year: Never true  Transportation Needs: No Transportation Needs (06/07/2023)   PRAPARE - Transportation   . Lack of Transportation (Medical): No   . Lack of Transportation (Non-Medical): No  Physical Activity: Unknown (06/06/2018)   Exercise Vital Sign   . Days of Exercise per Week: 3 days  Stress: No Stress Concern Present (12/06/2017)   [employer] of Occupational Health - Occupational Stress Questionnaire   . Feeling of Stress : Only a little  Social Connections: Unknown (12/06/2017)   Social Connection and Isolation Panel   . Marital Status: Married  Housing Stability: Low Risk  (06/03/2023)   Housing Stability Vital Sign   . Unable to Pay for Housing in the Last Year: No   . Number of Times Moved in the Last Year: 0   . Homeless in the Last Year: No    No Known Allergies  Outpatient Encounter Medications as of 08/10/2023  Medication Sig Dispense Refill  . acetaminophen  (TYLENOL ) 325 MG tablet Take 3 tablets (975 mg total) by  mouth every 6 (six) hours as needed for Pain (max 3 doses in 24 hours) 120 tablet 0  .  baclofen (LIORESAL) 10 MG tablet Take 1 tablet (10 mg total) by mouth 3 (three) times daily as needed    . carboxymethylcellulose (LUBRICATING PLUS) 0.5 % ophthalmic solution Place 1 drop into both eyes 3 (three) times daily as needed    . FUROsemide (LASIX) 20 MG tablet TAKE ONE TABLET BY MOUTH ONCE DAILY 90 tablet 3  . nadoloL (CORGARD) 20 MG tablet Take 0.5 tablets (10 mg total) by mouth at bedtime 45 tablet 0  . ondansetron  (ZOFRAN ) 8 MG tablet Take 1 tablet (8 mg total) by mouth every 8 (eight) hours as needed for Nausea or Vomiting 20 tablet 2  . pantoprazole (PROTONIX) 20 MG DR tablet TAKE ONE TABLET BY MOUTH EVERY MORNING 100 tablet 2  . prochlorperazine (COMPAZINE) 10 MG tablet Take 1 tablet (10 mg total) by mouth every 6 (six) hours as needed for Nausea or Vomiting 30 tablet 2  . rosuvastatin (CRESTOR) 10 MG tablet take one tablet by mouth every day 100 tablet 3  . spironolactone (ALDACTONE) 50 MG tablet Take 1 tablet (50 mg total) by mouth once daily 90 tablet 3  . tiotropium bromide (SPIRIVA RESPIMAT) 2.5 mcg/actuation inhalation spray Inhale 2 inhalations (5 mcg total) into the lungs once daily 4 g 11  . triamcinolone 0.1 % ointment Apply topically 2 (two) times daily to affected areas as needed for rash 60 g 1   No facility-administered encounter medications on file as of 08/10/2023.    PHYSICAL EXAMINATION   BP 129/76   Pulse 55   Temp 36.5 C (97.7 F) (Oral)   Resp 16   Ht 182.9 cm (6' 0.01)   Wt 79.7 kg (175 lb 11.3 oz)   SpO2 96%   BMI 23.82 kg/m   Wt Readings from Last 3 Encounters:  08/10/23 79.7 kg (175 lb 11.3 oz)  07/25/23 82.6 kg (182 lb 1.6 oz)  07/25/23 83.1 kg (183 lb 3.2 oz)    Vital signs reviewed. Constitutional: Well-developed, well-nourished male; NAD HEENT: Head normocephalic and atraumatic. Conjunctiva clear, no drainage. Scleral is non-icteric Cardiovascular:  Regular rate and rhythm. No murmurs, gallops, or rubs auscultated Respiratory: Lung sounds are clear in all lobes bilaterally without rales, rhonchi, or wheezes Musculoskeletal: MAEW Skin: Skin warm, dry and intact without rashes or lesions Neurologic: Alert and oriented x4. No gait abnormalities are appreciated Psychiatric: Appropriate mood and affect. Good judgment and insight  LABS/STUDIES   Recent Results (from the past week)  Comprehensive Metabolic Panel (CMP)   Collection Time: 08/10/23  8:26 AM  Result Value Ref Range   Sodium 136 135 - 145 mmol/L   Potassium 4.2 3.5 - 5.0 mmol/L   Chloride 103 98 - 108 mmol/L   Carbon Dioxide (CO2) 25 21 - 30 mmol/L   Urea Nitrogen (BUN) 11 7 - 20 mg/dL   Creatinine 1.4 (H) 0.6 - 1.3 mg/dL   Glucose 95 70 - 859 mg/dL   Calcium 9.3 8.7 - 89.7 mg/dL   AST (Aspartate Aminotransferase) 40 15 - 41 U/L   ALT (Alanine Aminotransferase) 40 15 - 50 U/L   Bilirubin, Total 1.0 0.4 - 1.5 mg/dL   Alk Phos (Alkaline Phosphatase) 84 24 - 110 U/L   Albumin 4.1 3.5 - 4.8 g/dL   Protein, Total 6.8 6.2 - 8.1 g/dL   Anion Gap 8 3 - 12 mmol/L   BUN/CREA Ratio 8 6 - 27   Glomerular Filtration Rate (eGFR)  57 mL/min/1.73sq m  Complete Blood Count (CBC) with Differential  Collection Time: 08/10/23  8:26 AM  Result Value Ref Range   WBC (White Blood Cell Count) 7.1 3.2 - 9.8 x10^9/L   Hemoglobin 13.3 (L) 13.7 - 17.3 g/dL   Hematocrit 61.7 (L) 60.9 - 49.0 %   Platelets 189 150 - 450 x10^9/L   MCV (Mean Corpuscular Volume) 101 (H) 80 - 98 fL   MCH (Mean Corpuscular Hemoglobin) 35.2 (H) 26.5 - 34.0 pg   MCHC (Mean Corpuscular Hemoglobin Concentration) 34.8 31.5 - 36.3 %   RBC (Red Blood Cell Count) 3.78 (L) 4.37 - 5.74 x10^12/L   RDW-CV (Red Cell Distribution Width) 12.6 11.5 - 14.5 %   NRBC (Nucleated Red Blood Cell Count) 0.00 0 x10^9/L   NRBC % (Nucleated Red Blood Cell %) 0.0 %   MPV (Mean Platelet Volume) 10.9 7.2 - 11.7 fL   Neutrophil Count 5.1 2.0  - 8.6 x10^9/L   Neutrophil % 72.0 37 - 80 %   Lymphocyte Count 1.2 0.6 - 4.2 x10^9/L   Lymphocyte % 17.1 10 - 50 %   Monocyte Count 0.6 0 - 0.9 x10^9/L   Monocyte % 8.3 0 - 12 %   Eosinophil Count 0.10 0 - 0.70 x10^9/L   Eosinophil % 1.4 0 - 7 %   Basophil Count 0.04 0 - 0.20 x10^9/L   Basophil % 0.6 0 - 2 %   Immature Granulocyte Count 0.04 <=0.06 x10^9/L   Immature Granulocyte % 0.6 <=0.7 %     All studies completed in the past week were reviewed personally in the clinic.  CARE TEAM  Patient Care Team: Ramji, Amrita, NP as PCP - General (Family Medicine) Ryan Damien Eagles, NP as Nurse Practitioner (Anesthesiology) Cosme Million, MD (Orthopedic Surgery) Chazan, Delon Odor, PA as Referring Physician (Orthopedic Surgery) Jayo, Cecille, RN as Registered Nurse  FUTURE APPOINTMENTS   Future Appointments     Date/Time Provider Department Center Visit Type   09/02/2023 9:15 AM (Arrive by 9:00 AM) TIMOTHY KATHEE [facility] [place] [facility] RETURN VISIT   09/15/2023 9:00 AM (Arrive by 8:45 AM) LAB-CANCER CENTER [facility] Lab Draw [facility] Ctr LAB   09/15/2023 10:30 AM CC NM INJ 1N04 Cancer Center  Nuc Med Cancer Ctr NM BONE SCAN WHOLE BODY INJ   09/15/2023 12:30 PM CC NM SR 1N09 Cancer Center  Nuc Med Cancer Ctr NM BONE SCAN WHOLE BODY SCAN   09/16/2023 9:00 AM CC PET INJ 1N28 Cancer Center  PET Cancer Ctr PET FDG INJ   09/16/2023 10:30 AM CC PET SR 3 Cancer Center  PET Cancer Ctr PET FDG SCAN   09/16/2023 1:30 PM (Arrive by 1:15 PM) Keenan Donnice Duncans, MD [facility] Clinic Cancer Ctr RETURN VISIT   11/15/2023 8:00 AM (Arrive by 7:45 AM) POPULATION HEALTH NURSE - [facility] WELLNESS   11/15/2023 8:40 AM (Arrive by 8:25 AM) Shelley Lax, NP [facility] OFFICE VISIT   11/28/2023 2:00 PM (Arrive by 1:30 PM) Adele Quinn Helling, MD [facility] RETURN VISIT       The plan was discussed in detail with the patient today, who expressed understanding.  The patient has my contact information, and understands to call me with any additional questions or concerns.  Attestation Statement:   This note has been created using automated tools and reviewed for accuracy by CANDACE ELVINA MARTINDALE.  I personally performed the service, non-incident to.  (WP)   Candace E  Tonette, AGNP-C

## 2023-08-22 NOTE — Progress Notes (Signed)
 Message rec'd from the Corey Stone about issues that he woke up with today. His lower back in the center and also his left hip are very painful for him, to walk, sit, and stand. He hasn't fallen at all. We don't know which Doctor to make an appointment with.  His 06-24-2023 PET didn't show any concerns about this area.  I recommended they contact his PCP for a physical exam, possibly a lumbar XR.  I prescribed a muscle relaxant (Robaxin, #20), asking them to seek any refills from PCP or med onc since our group is following him only as needed.

## 2023-08-24 ENCOUNTER — Other Ambulatory Visit: Payer: Self-pay

## 2023-08-24 ENCOUNTER — Emergency Department (HOSPITAL_COMMUNITY)
Admission: EM | Admit: 2023-08-24 | Discharge: 2023-08-24 | Disposition: A | Discharge status: Home / Self Care | Attending: Student | Admitting: Student

## 2023-08-24 ENCOUNTER — Emergency Department (HOSPITAL_COMMUNITY)

## 2023-08-24 ENCOUNTER — Encounter (HOSPITAL_COMMUNITY): Payer: Self-pay | Admitting: Emergency Medicine

## 2023-08-24 DIAGNOSIS — Z7982 Long term (current) use of aspirin: Secondary | ICD-10-CM | POA: Insufficient documentation

## 2023-08-24 DIAGNOSIS — M25552 Pain in left hip: Secondary | ICD-10-CM | POA: Diagnosis present

## 2023-08-24 DIAGNOSIS — M545 Low back pain, unspecified: Secondary | ICD-10-CM | POA: Insufficient documentation

## 2023-08-24 DIAGNOSIS — Z85830 Personal history of malignant neoplasm of bone: Secondary | ICD-10-CM | POA: Insufficient documentation

## 2023-08-24 DIAGNOSIS — Z85828 Personal history of other malignant neoplasm of skin: Secondary | ICD-10-CM | POA: Insufficient documentation

## 2023-08-24 HISTORY — DX: Secondary malignant neoplasm of unspecified site: C79.9

## 2023-08-24 HISTORY — DX: Malignant neoplasm of mandible: C41.1

## 2023-08-24 LAB — COMPREHENSIVE METABOLIC PANEL WITH GFR
ALT: 20 U/L (ref 0–44)
AST: 23 U/L (ref 15–41)
Albumin: 3.6 g/dL (ref 3.5–5.0)
Alkaline Phosphatase: 84 U/L (ref 38–126)
Anion gap: 12 (ref 5–15)
BUN: 12 mg/dL (ref 8–23)
CO2: 22 mmol/L (ref 22–32)
Calcium: 9 mg/dL (ref 8.9–10.3)
Chloride: 99 mmol/L (ref 98–111)
Creatinine, Ser: 1.04 mg/dL (ref 0.61–1.24)
GFR, Estimated: >60 mL/min (ref >60)
Glucose, Bld: 108 mg/dL — ABNORMAL HIGH (ref 70–99)
Potassium: 3.8 mmol/L (ref 3.5–5.1)
Sodium: 133 mmol/L — ABNORMAL LOW (ref 135–145)
Total Bilirubin: 1.8 mg/dL — ABNORMAL HIGH (ref 0.0–1.2)
Total Protein: 6.6 g/dL (ref 6.5–8.1)

## 2023-08-24 LAB — CBC WITH DIFFERENTIAL/PLATELET
Abs Immature Granulocytes: 0.05 K/uL (ref 0.00–0.07)
Basophils Absolute: 0 K/uL (ref 0.0–0.1)
Basophils Relative: 0 %
Eosinophils Absolute: 0.1 K/uL (ref 0.0–0.5)
Eosinophils Relative: 1 %
HCT: 36.4 % — ABNORMAL LOW (ref 39.0–52.0)
Hemoglobin: 12.7 g/dL — ABNORMAL LOW (ref 13.0–17.0)
Immature Granulocytes: 1 %
Lymphocytes Relative: 9 %
Lymphs Abs: 1 K/uL (ref 0.7–4.0)
MCH: 35.7 pg — ABNORMAL HIGH (ref 26.0–34.0)
MCHC: 34.9 g/dL (ref 30.0–36.0)
MCV: 102.2 fL — ABNORMAL HIGH (ref 80.0–100.0)
Monocytes Absolute: 1.1 K/uL — ABNORMAL HIGH (ref 0.1–1.0)
Monocytes Relative: 10 %
Neutro Abs: 8.6 K/uL — ABNORMAL HIGH (ref 1.7–7.7)
Neutrophils Relative %: 79 %
Platelets: 165 K/uL (ref 150–400)
RBC: 3.56 MIL/uL — ABNORMAL LOW (ref 4.22–5.81)
RDW: 12.5 % (ref 11.5–15.5)
WBC: 10.8 K/uL — ABNORMAL HIGH (ref 4.0–10.5)
nRBC: 0 % (ref 0.0–0.2)

## 2023-08-24 MED ORDER — LIDOCAINE 5 % EX PTCH
1.0000 | MEDICATED_PATCH | CUTANEOUS | Status: DC
  Administered 2023-08-24: 1 via TRANSDERMAL
  Filled 2023-08-24: qty 1

## 2023-08-24 MED ORDER — ONDANSETRON HCL 4 MG/2ML IJ SOLN
4.0000 mg | Freq: Once | INTRAMUSCULAR | Status: AC
  Administered 2023-08-24: 4 mg via INTRAVENOUS
  Filled 2023-08-24: qty 2

## 2023-08-24 MED ORDER — NAPROXEN 500 MG PO TABS: 500.0000 mg | ORAL_TABLET | Freq: Two times a day (BID) | ORAL | 0 refills | Status: AC

## 2023-08-24 MED ORDER — LIDOCAINE 5 % EX PTCH: 1.0000 | MEDICATED_PATCH | CUTANEOUS | 0 refills | Status: AC

## 2023-08-24 MED ORDER — HEPARIN SOD (PORK) LOCK FLUSH 100 UNIT/ML IV SOLN
500.0000 [IU] | Freq: Once | INTRAVENOUS | Status: AC
  Administered 2023-08-24: 500 [IU] via INTRAVENOUS
  Filled 2023-08-24: qty 5

## 2023-08-24 MED ORDER — KETOROLAC TROMETHAMINE 15 MG/ML IJ SOLN
15.0000 mg | Freq: Once | INTRAMUSCULAR | Status: AC
  Administered 2023-08-24: 15 mg via INTRAVENOUS
  Filled 2023-08-24: qty 1

## 2023-08-24 MED ORDER — DEXAMETHASONE SODIUM PHOSPHATE 10 MG/ML IJ SOLN
8.0000 mg | Freq: Once | INTRAMUSCULAR | Status: AC
  Administered 2023-08-24: 8 mg via INTRAVENOUS
  Filled 2023-08-24: qty 1

## 2023-08-24 MED ORDER — HYDROMORPHONE HCL 1 MG/ML IJ SOLN
1.0000 mg | Freq: Once | INTRAMUSCULAR | Status: AC
  Administered 2023-08-24: 1 mg via INTRAVENOUS
  Filled 2023-08-24: qty 1

## 2023-08-24 MED ORDER — GABAPENTIN 300 MG PO CAPS
300.0000 mg | ORAL_CAPSULE | Freq: Once | ORAL | Status: AC
  Administered 2023-08-24: 300 mg via ORAL
  Filled 2023-08-24: qty 1

## 2023-08-24 MED ORDER — OXYCODONE HCL 5 MG PO TABS
5.0000 mg | ORAL_TABLET | Freq: Four times a day (QID) | ORAL | 0 refills | Status: AC | PRN
Start: 2023-08-24 — End: ?

## 2023-08-24 MED ORDER — ACETAMINOPHEN 500 MG PO TABS: 1000.0000 mg | ORAL_TABLET | Freq: Three times a day (TID) | ORAL | 0 refills | Status: AC

## 2023-08-24 MED ORDER — METHYLPREDNISOLONE 4 MG PO TBPK
ORAL_TABLET | ORAL | 0 refills | Status: AC
Start: 2023-08-24 — End: ?

## 2023-08-24 MED ORDER — OXYCODONE HCL 5 MG PO TABS
5.0000 mg | ORAL_TABLET | Freq: Once | ORAL | Status: AC
  Administered 2023-08-24: 5 mg via ORAL
  Filled 2023-08-24: qty 1

## 2023-08-24 NOTE — Discharge Instructions (Signed)
 For pain:  - Acetaminophen  1000 mg three times daily (every 8 hours) - Naproxen  2 times daily (every 12 hours) - oxycodone  for breakthrough pain only - lidoderm  patches twice daily (every 12 hours) - steroids as prescribed

## 2023-08-24 NOTE — ED Provider Notes (Signed)
  Physical Exam  BP 134/70 (BP Location: Left Arm)   Pulse (!) 53   Temp 97.8 F (36.6 C)   Resp 20   Ht 6' 2 (1.88 m)   Wt 70.3 kg   SpO2 94%   BMI 19.90 kg/m   Physical Exam Constitutional:      General: He is not in acute distress.    Appearance: Normal appearance.  HENT:     Head: Normocephalic and atraumatic.     Nose: No congestion or rhinorrhea.  Eyes:     General:        Right eye: No discharge.        Left eye: No discharge.     Extraocular Movements: Extraocular movements intact.     Pupils: Pupils are equal, round, and reactive to light.  Cardiovascular:     Rate and Rhythm: Normal rate and regular rhythm.     Heart sounds: No murmur heard. Pulmonary:     Effort: No respiratory distress.     Breath sounds: No wheezing or rales.  Abdominal:     General: There is no distension.     Tenderness: There is no abdominal tenderness.  Musculoskeletal:        General: Tenderness present. Normal range of motion.     Cervical back: Normal range of motion.  Skin:    General: Skin is warm and dry.  Neurological:     General: No focal deficit present.     Mental Status: He is alert.     Procedures  Procedures  ED Course / MDM    Medical Decision Making Amount and/or Complexity of Data Reviewed Labs: ordered. Radiology: ordered.  Risk OTC drugs. Prescription drug management.   Patient received an handoff.  Back pain pending CTs to rule out metastatic lesions.  CTs reassuringly negative for metastatic lesions but do show a chronic compression fracture.  Neurologic Sam is unremarkable with no saddle anesthesia, numbness or weakness of lower extremities.  Patient pain controlled in the emergency department but currently does not meet inpatient criteria for admission will be discharged with outpatient follow-up.  Return precautions given to which she voiced understanding.       Albertina Dixon, MD 08/24/23 [phone]

## 2023-08-24 NOTE — ED Provider Notes (Signed)
 [place] [facility] AT [facility] Provider Note   CSN: [id number] Arrival date & time: 08/24/23  9377     Patient presents with: Back Pain   Corey Stone is a 63 y.o. male.   HPI     This is a 63 year old male with history of metastatic squamous cell carcinoma who presents with back pain and left hip pain.  Onset of symptoms on Monday.  Patient reports that he currently is not undergoing active treatment and is under surveillance for his cancer treatment.  He developed atraumatic lower back pain and left hip pain which is hindering him from walking.  Denies any injury.  No fevers.  Denies bowel or bladder difficulties.  Was prescribed Robaxin by the nurse at [facility] but that is not helping.  He is also taking his normal pain medication at home.  Prior to Admission medications   Medication Sig Start Date End Date Taking? Authorizing Provider  Aspirin-Salicylamide-Caffeine (BC HEADACHE POWDER PO) Take 1 packet by mouth 3 (three) times daily as needed (for pain). For pain     [provider]  B Complex-Biotin-FA (B-COMPLEX PO) Take 1 tablet by mouth 2 (two) times daily.    [provider]  Cholecalciferol (VITAMIN D) 2000 units CAPS Take 1 capsule by mouth daily.    [provider]  famotidine  (PEPCID ) 20 MG tablet Take 1 tablet (20 mg total) by mouth 2 (two) times daily. 06/14/16   Zackowski, Scott, MD  tetrahydrozoline-zinc (VISINE-AC) 0.05-0.25 % ophthalmic solution Place 2 drops into both eyes daily as needed. For dry eyes     [provider]  vitamin B-12 (CYANOCOBALAMIN) 500 MCG tablet Take 500 mcg by mouth 2 (two) times daily.    [provider]    Allergies: Patient has no known allergies.    Review of Systems  Constitutional:  Negative for fever.  Genitourinary:  Negative for difficulty urinating.  Musculoskeletal:  Positive for back pain.  Neurological:  Negative for weakness and numbness.  All other systems  reviewed and are negative.   Updated Vital Signs BP 134/78   Pulse (!) 58   Temp 97.8 F (36.6 C)   Resp 17   SpO2 100%   Physical Exam Vitals and nursing note reviewed.  Constitutional:      Appearance: He is well-developed.     Comments: Chronically ill-appearing but nontoxic  HENT:     Head: Normocephalic and atraumatic.  Eyes:     Pupils: Pupils are equal, round, and reactive to light.  Cardiovascular:     Rate and Rhythm: Normal rate and regular rhythm.     Heart sounds: Normal heart sounds. No murmur heard. Pulmonary:     Effort: Pulmonary effort is normal. No respiratory distress.     Breath sounds: Normal breath sounds.  Abdominal:     Palpations: Abdomen is soft.     Tenderness: There is no abdominal tenderness. There is no rebound.  Musculoskeletal:     Cervical back: Neck supple.     Comments: Lower lumbar scar noted, midline tenderness to palpation without step-off or deformity noted  Lymphadenopathy:     Cervical: No cervical adenopathy.  Skin:    General: Skin is warm and dry.  Neurological:     Mental Status: He is alert and oriented to person, place, and time.     Comments: 5 out of 5 strength bilateral lower extremities, equal patellar reflexes bilaterally  Psychiatric:  Mood and Affect: Mood normal.     (all labs ordered are listed, but only abnormal results are displayed) Labs Reviewed  CBC WITH DIFFERENTIAL/PLATELET  BASIC METABOLIC PANEL WITH GFR    EKG: None  Radiology: No results found.   Procedures   Medications Ordered in the ED  HYDROmorphone  (DILAUDID ) injection 1 mg (has no administration in time range)  ondansetron  (ZOFRAN ) injection 4 mg (has no administration in time range)                                    Medical Decision Making Amount and/or Complexity of Data Reviewed Labs: ordered. Radiology: ordered.  Risk OTC drugs. Prescription drug management.   This patient presents to the ED for concern of  back pain, hip pain, this involves an extensive number of treatment options, and is a complaint that carries with it a high risk of complications and morbidity.  I considered the following differential and admission for this acute, potentially life threatening condition.  The differential diagnosis includes pathologic fracture, metastatic disease, musculoskeletal etiology  MDM:    This is a 63 year old male who presents with acute change in pain.  History of significant metastatic squamous cell carcinoma.  No known lesions at this lumbar spine or left hip.  He is nontoxic and vital signs are reassuring.  Patient remaining nausea medication.  Imaging and lab work ordered.  (Labs, imaging, consults)  Labs: I Ordered, and personally interpreted labs.  The pertinent results include: CBC, CMP  Imaging Studies ordered: I ordered imaging studies including lumbar CT, pelvis CT I independently visualized and interpreted imaging. I agree with the radiologist interpretation  Additional history obtained from chart review.  External records from outside source obtained and reviewed including [facility] oncology notes  Cardiac Monitoring: The patient was maintained on a cardiac monitor.  If on the cardiac monitor, I personally viewed and interpreted the cardiac monitored which showed an underlying rhythm of: Sinus  Reevaluation: After the interventions noted above, I reevaluated the patient and found that they have :stayed the same  Social Determinants of Health:  lives independently  Disposition:, Signed out to oncoming provider  Co morbidities that complicate the patient evaluation  Past Medical History:  Diagnosis Date   Cirrhosis (HCC)    Metastatic carcinoma (HCC)    Squamous cell carcinoma of mandible (HCC)    2022     Medicines Meds ordered this encounter  Medications   HYDROmorphone  (DILAUDID ) injection 1 mg   ondansetron  (ZOFRAN ) injection 4 mg   ketorolac  (TORADOL ) 15 MG/ML  injection 15 mg   gabapentin  (NEURONTIN ) capsule 300 mg   DISCONTD: lidocaine  (LIDODERM ) 5 % 1 patch   dexamethasone  (DECADRON ) injection 8 mg   oxyCODONE  (Oxy IR/ROXICODONE ) immediate release tablet 5 mg    Refill:  0   methylPREDNISolone  (MEDROL  DOSEPAK) 4 MG TBPK tablet    Sig: Take as prescribed    Dispense:  1 each    Refill:  0   acetaminophen  (TYLENOL ) 500 MG tablet    Sig: Take 2 tablets (1,000 mg total) by mouth every 8 (eight) hours.    Dispense:  180 tablet    Refill:  0   naproxen  (NAPROSYN ) 500 MG tablet    Sig: Take 1 tablet (500 mg total) by mouth 2 (two) times daily for 10 days.    Dispense:  20 tablet    Refill:  0  lidocaine  (LIDODERM ) 5 %    Sig: Place 1 patch onto the skin daily. Remove & Discard patch within 12 hours or as directed by MD    Dispense:  30 patch    Refill:  0   oxyCODONE  (ROXICODONE ) 5 MG immediate release tablet    Sig: Take 1 tablet (5 mg total) by mouth every 6 (six) hours as needed for breakthrough pain.    Dispense:  10 tablet    Refill:  0   heparin  lock flush 100 unit/mL    I have reviewed the patients home medicines and have made adjustments as needed  Problem List / ED Course: Problem List Items Addressed This Visit   None Visit Diagnoses       Acute midline low back pain without sciatica    -  Primary   Relevant Medications   HYDROmorphone  (DILAUDID ) injection 1 mg (Completed)   ketorolac  (TORADOL ) 15 MG/ML injection 15 mg (Completed)   dexamethasone  (DECADRON ) injection 8 mg (Completed)   oxyCODONE  (Oxy IR/ROXICODONE ) immediate release tablet 5 mg (Completed)   methylPREDNISolone  (MEDROL  DOSEPAK) 4 MG TBPK tablet   acetaminophen  (TYLENOL ) 500 MG tablet   naproxen  (NAPROSYN ) 500 MG tablet   oxyCODONE  (ROXICODONE ) 5 MG immediate release tablet                Final diagnoses:  None    ED Discharge Orders     None          Bari Charmaine FALCON, MD 08/24/23 2257

## 2023-08-24 NOTE — ED Triage Notes (Signed)
 Pt bib [facility] from home c/o lower back pain that radiates to left hip. Hx of bone cancer, had recent PET scan on 5/16. Was prescribed robaxin, has been taken that along with Oxycodone  with no relief

## 2023-08-24 NOTE — ED Notes (Signed)
 ED Provider at bedside.

## 2023-08-24 NOTE — ED Notes (Signed)
 MD notified pain meds did not work and requesting more pain meds.

## 2023-08-24 NOTE — ED Notes (Signed)
 Patient transported to CT

## 2023-08-24 NOTE — ED Notes (Signed)
 Port flushed with heparin  lock per protocol and de-accessed
